# Patient Record
Sex: Male | Born: 1960 | Race: White | Hispanic: No | Marital: Married | Smoking: Never smoker
Health system: Southern US, Community
[De-identification: ages and names within clinical notes are randomized; demographics above are authoritative.]

## PROBLEM LIST (undated history)

## (undated) HISTORY — PX: CHOLECYSTECTOMY: SHX55

---

## 2016-04-29 DIAGNOSIS — E7800 Pure hypercholesterolemia, unspecified: Secondary | ICD-10-CM | POA: Diagnosis not present

## 2016-04-29 DIAGNOSIS — Z0000 Encounter for general adult medical examination without abnormal findings: Secondary | ICD-10-CM | POA: Diagnosis not present

## 2016-04-29 DIAGNOSIS — Z125 Encounter for screening for malignant neoplasm of prostate: Secondary | ICD-10-CM | POA: Diagnosis not present

## 2016-05-17 DIAGNOSIS — L578 Other skin changes due to chronic exposure to nonionizing radiation: Secondary | ICD-10-CM | POA: Diagnosis not present

## 2016-05-17 DIAGNOSIS — L821 Other seborrheic keratosis: Secondary | ICD-10-CM | POA: Diagnosis not present

## 2016-05-17 DIAGNOSIS — L570 Actinic keratosis: Secondary | ICD-10-CM | POA: Diagnosis not present

## 2016-06-01 DIAGNOSIS — H43393 Other vitreous opacities, bilateral: Secondary | ICD-10-CM | POA: Diagnosis not present

## 2016-07-13 DIAGNOSIS — H43393 Other vitreous opacities, bilateral: Secondary | ICD-10-CM | POA: Diagnosis not present

## 2017-02-27 DIAGNOSIS — H40003 Preglaucoma, unspecified, bilateral: Secondary | ICD-10-CM | POA: Diagnosis not present

## 2017-03-13 DIAGNOSIS — H40003 Preglaucoma, unspecified, bilateral: Secondary | ICD-10-CM | POA: Diagnosis not present

## 2017-04-18 DIAGNOSIS — L03032 Cellulitis of left toe: Secondary | ICD-10-CM | POA: Diagnosis not present

## 2017-04-28 DIAGNOSIS — E7800 Pure hypercholesterolemia, unspecified: Secondary | ICD-10-CM | POA: Diagnosis not present

## 2017-04-28 DIAGNOSIS — Z0000 Encounter for general adult medical examination without abnormal findings: Secondary | ICD-10-CM | POA: Diagnosis not present

## 2017-04-28 DIAGNOSIS — Z125 Encounter for screening for malignant neoplasm of prostate: Secondary | ICD-10-CM | POA: Diagnosis not present

## 2017-05-01 DIAGNOSIS — E7800 Pure hypercholesterolemia, unspecified: Secondary | ICD-10-CM | POA: Diagnosis not present

## 2017-05-01 DIAGNOSIS — M5136 Other intervertebral disc degeneration, lumbar region: Secondary | ICD-10-CM | POA: Diagnosis not present

## 2017-05-01 DIAGNOSIS — R739 Hyperglycemia, unspecified: Secondary | ICD-10-CM | POA: Diagnosis not present

## 2017-05-16 DIAGNOSIS — R739 Hyperglycemia, unspecified: Secondary | ICD-10-CM | POA: Diagnosis not present

## 2017-05-16 DIAGNOSIS — M5136 Other intervertebral disc degeneration, lumbar region: Secondary | ICD-10-CM | POA: Diagnosis not present

## 2017-05-16 DIAGNOSIS — E7800 Pure hypercholesterolemia, unspecified: Secondary | ICD-10-CM | POA: Diagnosis not present

## 2017-08-28 DIAGNOSIS — E7800 Pure hypercholesterolemia, unspecified: Secondary | ICD-10-CM | POA: Diagnosis not present

## 2017-08-28 DIAGNOSIS — R739 Hyperglycemia, unspecified: Secondary | ICD-10-CM | POA: Diagnosis not present

## 2017-09-04 DIAGNOSIS — E7800 Pure hypercholesterolemia, unspecified: Secondary | ICD-10-CM | POA: Diagnosis not present

## 2017-09-04 DIAGNOSIS — R739 Hyperglycemia, unspecified: Secondary | ICD-10-CM | POA: Diagnosis not present

## 2017-09-04 DIAGNOSIS — Z87898 Personal history of other specified conditions: Secondary | ICD-10-CM | POA: Diagnosis not present

## 2018-05-07 DIAGNOSIS — Z125 Encounter for screening for malignant neoplasm of prostate: Secondary | ICD-10-CM | POA: Diagnosis not present

## 2018-05-07 DIAGNOSIS — M5136 Other intervertebral disc degeneration, lumbar region: Secondary | ICD-10-CM | POA: Diagnosis not present

## 2018-05-07 DIAGNOSIS — R739 Hyperglycemia, unspecified: Secondary | ICD-10-CM | POA: Diagnosis not present

## 2018-05-07 DIAGNOSIS — E7800 Pure hypercholesterolemia, unspecified: Secondary | ICD-10-CM | POA: Diagnosis not present

## 2020-12-04 ENCOUNTER — Encounter: Payer: Self-pay | Admitting: Urology

## 2021-03-26 ENCOUNTER — Encounter: Payer: Self-pay | Admitting: Urology

## 2021-03-26 ENCOUNTER — Ambulatory Visit (INDEPENDENT_AMBULATORY_CARE_PROVIDER_SITE_OTHER): Payer: 59 | Admitting: Urology

## 2021-03-26 ENCOUNTER — Other Ambulatory Visit: Payer: Self-pay

## 2021-03-26 VITALS — BP 104/71 | HR 92 | Ht 75.0 in | Wt 198.0 lb

## 2021-03-26 DIAGNOSIS — R972 Elevated prostate specific antigen [PSA]: Secondary | ICD-10-CM

## 2021-03-26 NOTE — Progress Notes (Signed)
° °  03/26/2021 8:39 AM   Jeff Velazquez [birth year] [id number]  Referring provider: Adin Hector, MD [facility] [place],  [place] [address]  Chief Complaint  Patient presents with   Elevated PSA    HPI: Jeff Velazquez is a 61 y.o. male referred for evaluation of an elevated PSA.  PSA 03/11/2021 elevated 5.12 PSA has been mildly elevated since 2021 No bothersome LUTS-nocturia x1-2 and occasional urgency No UTI, dysuria or gross hematuria No family history prostate cancer    PMH: History reviewed. No pertinent past medical history.  Surgical History: Past Surgical History:  Procedure Laterality Date   CHOLECYSTECTOMY      Home Medications:  Allergies as of 03/26/2021   No Known Allergies      Medication List        Accurate as of March 26, 2021  8:39 AM. If you have any questions, ask your nurse or doctor.          STOP taking these medications    metFORMIN 500 MG 24 hr tablet Commonly known as: GLUCOPHAGE-XR Stopped by: Abbie Sons, MD        Allergies: No Known Allergies  Family History: History reviewed. No pertinent family history.  Social History:  reports that he has never smoked. He has never used smokeless tobacco. He reports current alcohol use. No history on file for drug use.   Physical Exam: BP 104/71    Pulse 92    Ht 6\' 3"  (1.905 m)    Wt 198 lb (89.8 kg)    BMI 24.75 kg/m   Constitutional:  Alert and oriented, No acute distress. HEENT: [place] AT, moist mucus membranes.  Trachea midline, no masses. Cardiovascular: No clubbing, cyanosis, or edema. Respiratory: Normal respiratory effort, no increased work of breathing. GU: Prostate 60 g, smooth without nodules Skin: No rashes, bruises or suspicious lesions. Neurologic: Grossly intact, no focal deficits, moving all 4 extremities. Psychiatric: Normal mood and affect.   Assessment & Plan:    1.  Elevated PSA Although PSA is a prostate cancer  screening test he was informed that cancer is not the most common cause of an elevated PSA. Other potential causes including BPH and inflammation were discussed.  We discussed that patients with a PSA <10 with a benign DRE there is an 80% chance it is secondary to benign disease and only a 5% chance of high-grade prostate cancer He was informed that the only way to adequately diagnose prostate cancer would be a transrectal ultrasound and biopsy of the prostate. The procedure was discussed including potential risks of bleeding and infection/sepsis. He was also informed that a negative biopsy does not conclusively rule out the possibility that prostate cancer may be present and that continued monitoring is required. The use of newer adjunctive blood tests including  4kScore was discussed. The use of multiparametric prostate MRI to evaluate for lesions suspicious for high-grade prostate cancer and aid in targeted biopsy was reviewed. Continued periodic surveillance was also discussed. After discussing options he would like to schedule prostate MRI.  He will be notified with results and further recommendations at that time   Abbie Sons, MD  [facility] [address], [place], [place] [address] [phone]

## 2021-04-05 ENCOUNTER — Telehealth: Payer: Self-pay | Admitting: Urology

## 2021-04-05 NOTE — Telephone Encounter (Signed)
Pt called in and would like to know the status of getting his MRI appt scheduled and whether it was approved by his insurance. He stressed the fact he was last seen on 2/3 and still has not heard anything. He would like a call back in regards to this matter, I informed pt that he may not receive his call today.

## 2021-04-05 NOTE — Telephone Encounter (Signed)
Talked with patient advised his no prior auth needed ad they will be calling him to get it scheduled

## 2021-04-21 ENCOUNTER — Ambulatory Visit
Admission: RE | Admit: 2021-04-21 | Discharge: 2021-04-21 | Disposition: A | Discharge status: Home / Self Care | Payer: 59 | Source: Ambulatory Visit | Attending: Urology | Admitting: Urology

## 2021-04-21 DIAGNOSIS — R972 Elevated prostate specific antigen [PSA]: Secondary | ICD-10-CM | POA: Insufficient documentation

## 2021-04-21 MED ORDER — GADOBUTROL 1 MMOL/ML IV SOLN
9.0000 mL | Freq: Once | INTRAVENOUS | Status: AC | PRN
  Administered 2021-04-21: 9 mL via INTRAVENOUS

## 2021-04-26 ENCOUNTER — Telehealth: Payer: Self-pay

## 2021-04-26 NOTE — Telephone Encounter (Signed)
Pt called asking for MRI results 

## 2021-04-26 NOTE — Telephone Encounter (Signed)
MRI did show 2 indeterminate lesions meaning they could represent benign tissue or prostate cancer.  Would recommend scheduling a fusion biopsy where these abnormal areas can be directly targeted.  We have these performed by the urology practice in [place].  Please let me know if he has any questions and I will contact him tomorrow.  If not I will go ahead and put in the referral for the fusion biopsy ?

## 2021-04-26 NOTE — Telephone Encounter (Signed)
Notified patient as instructed.   

## 2021-04-28 NOTE — Telephone Encounter (Signed)
Patient was contacted.  We discussed the PI-RADS classification.  He has 2 PI-RADS 3 lesions.  He was informed these are considered indeterminant and could represent areas of prostate cancer or benign prostate tissue.  Fusion biopsy was described and he was informed that a standard 12 core biopsy is typically performed in addition to targeted biopsy of abnormal areas on MRI.  He asked about transrectal versus transperineal biopsies.  We discussed the infection rate is lower transperineal biopsies though are less commonly performed.  He does want to proceed with a biopsy but would like to research other facilities that perform and indicated he would call back if the to schedule or have his MRI sent to the PACS system of another facility. ?

## 2022-09-28 ENCOUNTER — Other Ambulatory Visit: Payer: Self-pay | Admitting: Internal Medicine

## 2022-09-28 DIAGNOSIS — E7800 Pure hypercholesterolemia, unspecified: Secondary | ICD-10-CM

## 2022-09-28 DIAGNOSIS — Z0000 Encounter for general adult medical examination without abnormal findings: Secondary | ICD-10-CM

## 2022-09-28 DIAGNOSIS — E1165 Type 2 diabetes mellitus with hyperglycemia: Secondary | ICD-10-CM

## 2022-10-07 ENCOUNTER — Ambulatory Visit
Admission: RE | Admit: 2022-10-07 | Discharge: 2022-10-07 | Disposition: A | Discharge status: Home / Self Care | Payer: 59 | Source: Ambulatory Visit | Attending: Internal Medicine | Admitting: Internal Medicine

## 2022-10-07 DIAGNOSIS — E1165 Type 2 diabetes mellitus with hyperglycemia: Secondary | ICD-10-CM | POA: Insufficient documentation

## 2022-10-07 DIAGNOSIS — E7800 Pure hypercholesterolemia, unspecified: Secondary | ICD-10-CM | POA: Insufficient documentation

## 2022-10-07 DIAGNOSIS — Z0000 Encounter for general adult medical examination without abnormal findings: Secondary | ICD-10-CM | POA: Insufficient documentation

## 2022-10-14 ENCOUNTER — Encounter: Payer: Self-pay | Admitting: Internal Medicine

## 2022-10-17 ENCOUNTER — Other Ambulatory Visit (HOSPITAL_COMMUNITY): Payer: Self-pay | Admitting: Internal Medicine

## 2022-10-17 DIAGNOSIS — I2510 Atherosclerotic heart disease of native coronary artery without angina pectoris: Secondary | ICD-10-CM

## 2022-10-26 ENCOUNTER — Telehealth (HOSPITAL_COMMUNITY): Payer: Self-pay | Admitting: Emergency Medicine

## 2022-10-26 DIAGNOSIS — R079 Chest pain, unspecified: Secondary | ICD-10-CM

## 2022-10-26 DIAGNOSIS — R0789 Other chest pain: Secondary | ICD-10-CM

## 2022-10-26 MED ORDER — METOPROLOL TARTRATE 25 MG PO TABS: 25.0000 mg | ORAL_TABLET | Freq: Once | ORAL | 0 refills | Status: AC

## 2022-10-26 NOTE — Telephone Encounter (Signed)
Reaching out to patient to offer assistance regarding upcoming cardiac imaging study; pt verbalizes understanding of appt date/time, parking situation and where to check in, pre-test NPO status and medications ordered, and verified current allergies; name and call back number provided for further questions should they arise Rockwell Alexandria RN Navigator Cardiac Imaging [facility] [phone] office [phone] cell  25mg  metoprolol sent to pharm  Spoke with [facility] team who confirmed NSR on last EKG

## 2022-10-28 ENCOUNTER — Ambulatory Visit (HOSPITAL_COMMUNITY)
Admission: RE | Admit: 2022-10-28 | Discharge: 2022-10-28 | Disposition: A | Discharge status: Home / Self Care | Payer: 59 | Source: Ambulatory Visit | Attending: Internal Medicine | Admitting: Internal Medicine

## 2022-10-28 DIAGNOSIS — I2510 Atherosclerotic heart disease of native coronary artery without angina pectoris: Secondary | ICD-10-CM | POA: Diagnosis present

## 2022-10-28 DIAGNOSIS — I2584 Coronary atherosclerosis due to calcified coronary lesion: Secondary | ICD-10-CM

## 2022-10-28 MED ORDER — NITROGLYCERIN 0.4 MG SL SUBL
0.8000 mg | SUBLINGUAL_TABLET | Freq: Once | SUBLINGUAL | Status: AC
  Administered 2022-10-28: 0.8 mg via SUBLINGUAL

## 2022-10-28 MED ORDER — NITROGLYCERIN 0.4 MG SL SUBL
SUBLINGUAL_TABLET | SUBLINGUAL | Status: AC
  Filled 2022-10-28: qty 2

## 2022-10-28 MED ORDER — IOHEXOL 350 MG/ML SOLN
95.0000 mL | Freq: Once | INTRAVENOUS | Status: AC | PRN
  Administered 2022-10-28: 95 mL via INTRAVENOUS

## 2022-11-02 ENCOUNTER — Ambulatory Visit: Payer: 59

## 2023-03-11 IMAGING — MR MR PROSTATE WO/W CM
56 series · 56 of 56 positions shown · IV contrast (9ml Gadavist)
Comparison: None.

CLINICAL DATA: Elevated and rising PSA level. PSA level on
03/11/2021 was 5.12.

EXAM:
MR PROSTATE WITHOUT AND WITH CONTRAST
TECHNIQUE: Multiplanar multisequence MRI images were obtained of the pelvis
centered about the prostate. Pre and post contrast images were
obtained.
CONTRAST:  9mL GADAVIST GADOBUTROL 1 MMOL/ML IV SOLN

[Series 3: ax in&out whole · axial · 3.0mm · 1.19mm/px · 1 of 88 slices shown (1 of 2)]
[im 1/88]
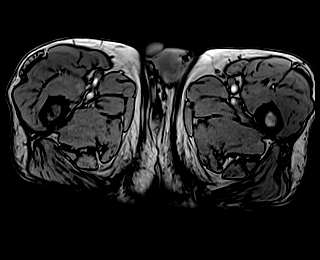

[Series 4: ax in&out whole · axial · 3.0mm · 1.19mm/px · 1 of 88 slices shown (2 of 2)]
[im 1/88]
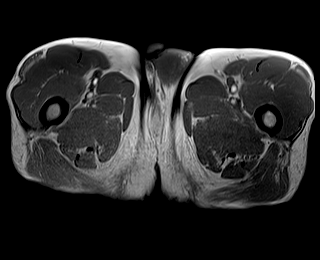

[Series 5: T2 · coronal · 3.0mm · 0.70mm/px · 1 of 35 slices shown (1 of 3)]
[im 1/35]
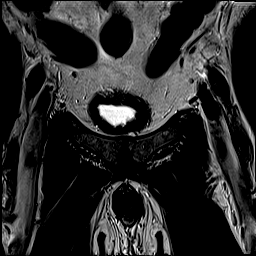

[Series 6: T2 · axial · 3.0mm · 0.56mm/px · 1 of 25 slices shown (2 of 3)]
[im 1/25]
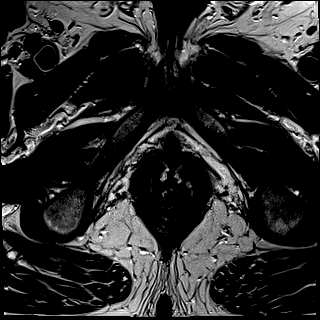

[Series 7: DWI · axial · 3.0mm · 0.86mm/px · 1 of 75 slices shown (1 of 3)]
[im 1/75]
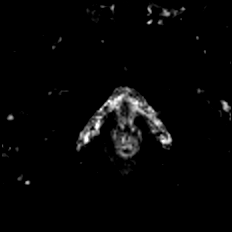

[Series 8: DWI · axial · 3.0mm · 0.86mm/px · 1 of 25 slices shown (2 of 3)]
[im 1/25]
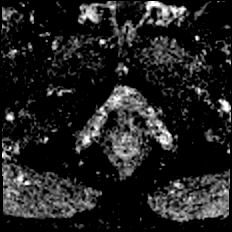

[Series 9: DWI · axial · 3.0mm · 0.86mm/px · 1 of 25 slices shown (3 of 3)]
[im 1/25]
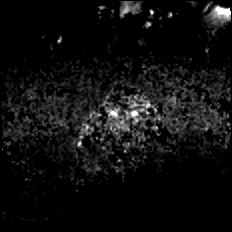

[Series 10: T2 · axial · 1.0mm · 1.04mm/px · 1 of 72 slices shown (3 of 3)]
[im 1/72]
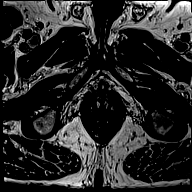

[Series 11: T1 · axial · 3.0mm · 1.15mm/px · 1 of 28 slices shown (1 of 48)]
[im 1/28]
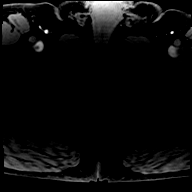

[Series 12: T1 · axial · 3.0mm · 1.15mm/px · 1 of 28 slices shown (2 of 48)]
[im 1/28]
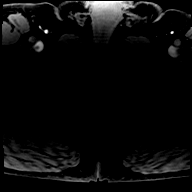

[Series 13: T1 · axial · 3.0mm · 1.15mm/px · 1 of 28 slices shown (3 of 48)]
[im 1/28]
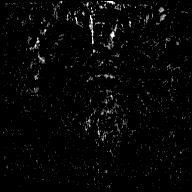

[Series 14: T1 · axial · 3.0mm · 1.15mm/px · 1 of 28 slices shown (4 of 48)]
[im 1/28]
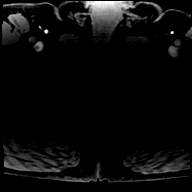

[Series 15: T1 · axial · 3.0mm · 1.15mm/px · 1 of 28 slices shown (5 of 48)]
[im 1/28]
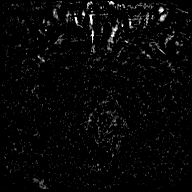

[Series 16: T1 · axial · 3.0mm · 1.15mm/px · 1 of 28 slices shown (6 of 48)]
[im 1/28]
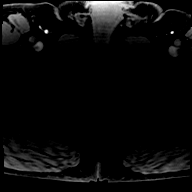

[Series 17: T1 · axial · 3.0mm · 1.15mm/px · 1 of 28 slices shown (7 of 48)]
[im 1/28]
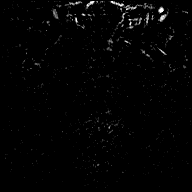

[Series 18: T1 · axial · 3.0mm · 1.15mm/px · 1 of 28 slices shown (8 of 48)]
[im 1/28]
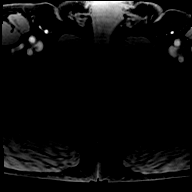

[Series 19: T1 · axial · 3.0mm · 1.15mm/px · 1 of 28 slices shown (9 of 48)]
[im 1/28]
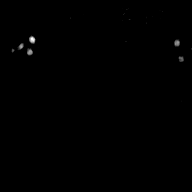

[Series 20: T1 · axial · 3.0mm · 1.15mm/px · 1 of 28 slices shown (10 of 48)]
[im 1/28]
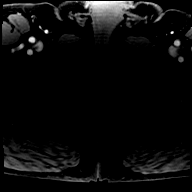

[Series 21: T1 · axial · 3.0mm · 1.15mm/px · 1 of 28 slices shown (11 of 48)]
[im 1/28]
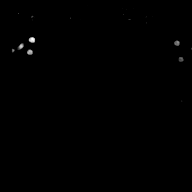

[Series 22: T1 · axial · 3.0mm · 1.15mm/px · 1 of 28 slices shown (12 of 48)]
[im 1/28]
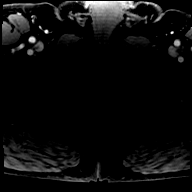

[Series 23: T1 · axial · 3.0mm · 1.15mm/px · 1 of 28 slices shown (13 of 48)]
[im 1/28]
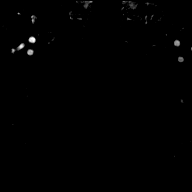

[Series 24: T1 · axial · 3.0mm · 1.15mm/px · 1 of 28 slices shown (14 of 48)]
[im 1/28]
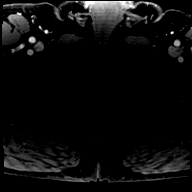

[Series 25: T1 · axial · 3.0mm · 1.15mm/px · 1 of 28 slices shown (15 of 48)]
[im 1/28]
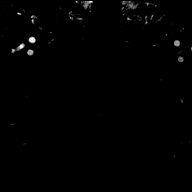

[Series 26: T1 · axial · 3.0mm · 1.15mm/px · 1 of 28 slices shown (16 of 48)]
[im 1/28]
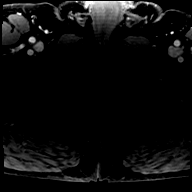

[Series 27: T1 · axial · 3.0mm · 1.15mm/px · 1 of 28 slices shown (17 of 48)]
[im 1/28]
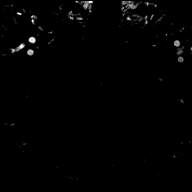

[Series 28: T1 · axial · 3.0mm · 1.15mm/px · 1 of 28 slices shown (18 of 48)]
[im 1/28]
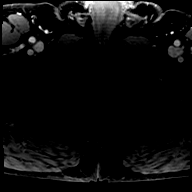

[Series 29: T1 · axial · 3.0mm · 1.15mm/px · 1 of 28 slices shown (19 of 48)]
[im 1/28]
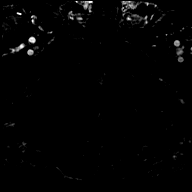

[Series 30: T1 · axial · 3.0mm · 1.15mm/px · 1 of 28 slices shown (20 of 48)]
[im 1/28]
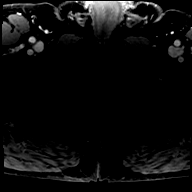

[Series 31: T1 · axial · 3.0mm · 1.15mm/px · 1 of 28 slices shown (21 of 48)]
[im 1/28]
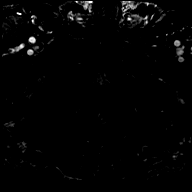

[Series 32: T1 · axial · 3.0mm · 1.15mm/px · 1 of 28 slices shown (22 of 48)]
[im 1/28]
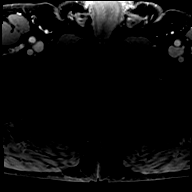

[Series 33: T1 · axial · 3.0mm · 1.15mm/px · 1 of 28 slices shown (23 of 48)]
[im 1/28]
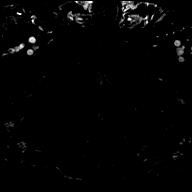

[Series 34: T1 · axial · 3.0mm · 1.15mm/px · 1 of 28 slices shown (24 of 48)]
[im 1/28]
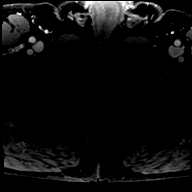

[Series 35: T1 · axial · 3.0mm · 1.15mm/px · 1 of 28 slices shown (25 of 48)]
[im 1/28]
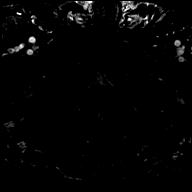

[Series 36: T1 · axial · 3.0mm · 1.15mm/px · 1 of 28 slices shown (26 of 48)]
[im 1/28]
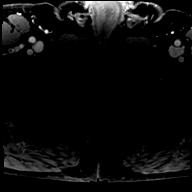

[Series 37: T1 · axial · 3.0mm · 1.15mm/px · 1 of 28 slices shown (27 of 48)]
[im 1/28]
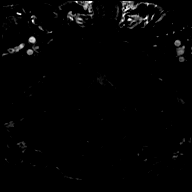

[Series 38: T1 · axial · 3.0mm · 1.15mm/px · 1 of 28 slices shown (28 of 48)]
[im 1/28]
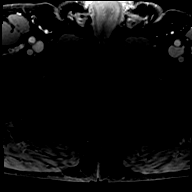

[Series 39: T1 · axial · 3.0mm · 1.15mm/px · 1 of 28 slices shown (29 of 48)]
[im 1/28]
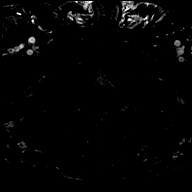

[Series 40: T1 · axial · 3.0mm · 1.15mm/px · 1 of 28 slices shown (30 of 48)]
[im 1/28]
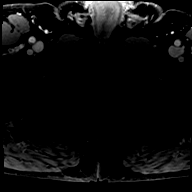

[Series 41: T1 · axial · 3.0mm · 1.15mm/px · 1 of 28 slices shown (31 of 48)]
[im 1/28]
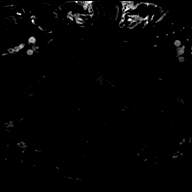

[Series 42: T1 · axial · 3.0mm · 1.15mm/px · 1 of 28 slices shown (32 of 48)]
[im 1/28]
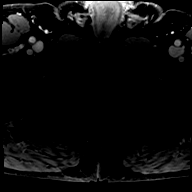

[Series 43: T1 · axial · 3.0mm · 1.15mm/px · 1 of 28 slices shown (33 of 48)]
[im 1/28]
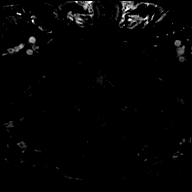

[Series 44: T1 · axial · 3.0mm · 1.15mm/px · 1 of 28 slices shown (34 of 48)]
[im 1/28]
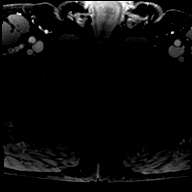

[Series 45: T1 · axial · 3.0mm · 1.15mm/px · 1 of 28 slices shown (35 of 48)]
[im 1/28]
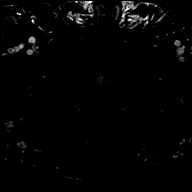

[Series 46: T1 · axial · 3.0mm · 1.15mm/px · 1 of 28 slices shown (36 of 48)]
[im 1/28]
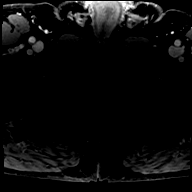

[Series 47: T1 · axial · 3.0mm · 1.15mm/px · 1 of 28 slices shown (37 of 48)]
[im 1/28]
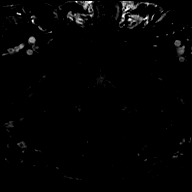

[Series 48: T1 · axial · 3.0mm · 1.15mm/px · 1 of 28 slices shown (38 of 48)]
[im 1/28]
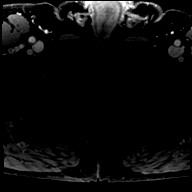

[Series 49: T1 · axial · 3.0mm · 1.15mm/px · 1 of 28 slices shown (39 of 48)]
[im 1/28]
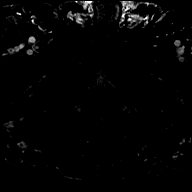

[Series 50: T1 · axial · 3.0mm · 1.15mm/px · 1 of 28 slices shown (40 of 48)]
[im 1/28]
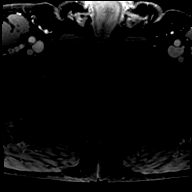

[Series 51: T1 · axial · 3.0mm · 1.15mm/px · 1 of 28 slices shown (41 of 48)]
[im 1/28]
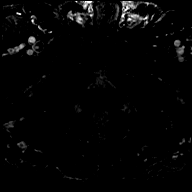

[Series 52: T1 · axial · 3.0mm · 1.15mm/px · 1 of 28 slices shown (42 of 48)]
[im 1/28]
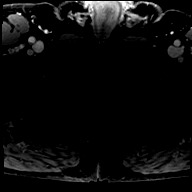

[Series 53: T1 · axial · 3.0mm · 1.15mm/px · 1 of 28 slices shown (43 of 48)]
[im 1/28]
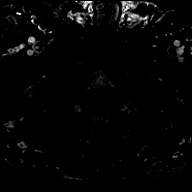

[Series 54: T1 · axial · 3.0mm · 1.15mm/px · 1 of 28 slices shown (44 of 48)]
[im 1/28]
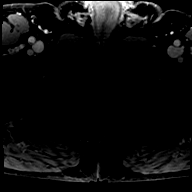

[Series 55: T1 · axial · 3.0mm · 1.15mm/px · 1 of 28 slices shown (45 of 48)]
[im 1/28]
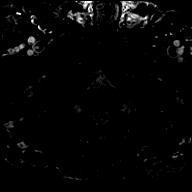

[Series 56: T1 · axial · 3.0mm · 1.15mm/px · 1 of 28 slices shown (46 of 48)]
[im 1/28]
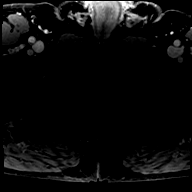

[Series 57: T1 · axial · 3.0mm · 1.15mm/px · 1 of 28 slices shown (47 of 48)]
[im 1/28]
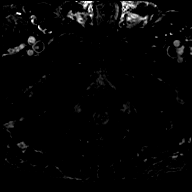

[Series 58: T1 · axial · 3.0mm · 1.15mm/px · 1 of 28 slices shown (48 of 48)]
[im 1/28]
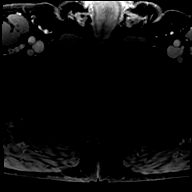

[56 of 56 positions shown; findings below may reference images not displayed]

FINDINGS: Prostate:

Region of interest # 1: PI-RADS category 3 lesion of the right
anterior, posterolateral, and posteromedial peripheral zone at the
apex and right posterolateral peripheral zone in the mid gland with
reduced T2 signal (image 52, series 10) without substantial early
enhancement or restriction of diffusion. This measures 0.84 cc (1.8
by 0.5 by 1.6 cm).

Region of interest # 2: PI-RADS category 3 lesion of the left
anterior, left posterolateral, and left posteromedial peripheral
zone in the mid gland with focally reduced T2 signal (image 40,
series 10) without substantial differential focal early enhancement
or restriction of diffusion. This measures 1.46 cc (2.7 by 0.8 by
1.8 cm).

Encapsulated nodularity in the transition zone compatible with
benign prostatic hypertrophy. Low T1 signal intensities in the
transition zone likely represent calcifications.

Volume: 3D volumetric analysis: Prostate volume 86.82 cc (6.0 by
by 6.1 cm).

Transcapsular spread:  Absent

Seminal vesicle involvement: Absent

Neurovascular bundle involvement: Absent

Pelvic adenopathy: Absent

Bone metastasis: Absent

Other findings: No supplemental non-categorized findings.
IMPRESSION: 1. Two PI-RADS category 3 lesions in the peripheral zone. Targeting
data sent to [facility].
2. Prostatomegaly and benign prostatic hypertrophy.
